# Patient Record
Sex: Female | Born: 2000 | Hispanic: No | Marital: Single | Smoking: Never smoker
Health system: Southern US, Community
[De-identification: ages and names within clinical notes are randomized; demographics above are authoritative.]

## PROBLEM LIST (undated history)

## (undated) ENCOUNTER — Inpatient Hospital Stay (HOSPITAL_COMMUNITY): Payer: Self-pay

## (undated) DIAGNOSIS — F99 Mental disorder, not otherwise specified: Secondary | ICD-10-CM

## (undated) DIAGNOSIS — F419 Anxiety disorder, unspecified: Secondary | ICD-10-CM

## (undated) DIAGNOSIS — Z91410 Personal history of adult physical and sexual abuse: Secondary | ICD-10-CM

## (undated) HISTORY — PX: DILATION AND EVACUATION: SHX1459

---

## 2018-03-26 ENCOUNTER — Encounter (HOSPITAL_COMMUNITY): Payer: Self-pay | Admitting: *Deleted

## 2018-03-26 ENCOUNTER — Inpatient Hospital Stay (HOSPITAL_COMMUNITY): Payer: Self-pay

## 2018-03-26 ENCOUNTER — Inpatient Hospital Stay (HOSPITAL_COMMUNITY)
Admission: AD | Admit: 2018-03-26 | Discharge: 2018-03-26 | Disposition: A | Discharge status: Home / Self Care | Payer: Self-pay | Source: Ambulatory Visit | Attending: Obstetrics & Gynecology | Admitting: Obstetrics & Gynecology

## 2018-03-26 DIAGNOSIS — O034 Incomplete spontaneous abortion without complication: Secondary | ICD-10-CM

## 2018-03-26 DIAGNOSIS — Z880 Allergy status to penicillin: Secondary | ICD-10-CM | POA: Insufficient documentation

## 2018-03-26 DIAGNOSIS — Z789 Other specified health status: Secondary | ICD-10-CM

## 2018-03-26 DIAGNOSIS — O4690 Antepartum hemorrhage, unspecified, unspecified trimester: Secondary | ICD-10-CM

## 2018-03-26 HISTORY — DX: Anxiety disorder, unspecified: F41.9

## 2018-03-26 HISTORY — DX: Mental disorder, not otherwise specified: F99

## 2018-03-26 LAB — HCG, QUANTITATIVE, PREGNANCY: hCG, Beta Chain, Quant, S: 1636 m[IU]/mL — ABNORMAL HIGH (ref <5)

## 2018-03-26 LAB — COMPREHENSIVE METABOLIC PANEL
ALT: 13 U/L (ref 0–44)
AST: 16 U/L (ref 15–41)
Albumin: 3.8 g/dL (ref 3.5–5.0)
Alkaline Phosphatase: 49 U/L (ref 38–126)
Anion gap: 7 (ref 5–15)
BUN: 10 mg/dL (ref 6–20)
CHLORIDE: 104 mmol/L (ref 98–111)
CO2: 23 mmol/L (ref 22–32)
Calcium: 9.1 mg/dL (ref 8.9–10.3)
Creatinine, Ser: 0.51 mg/dL (ref 0.44–1.00)
GFR calc Af Amer: >60 mL/min (ref >60)
GFR calc non Af Amer: >60 mL/min (ref >60)
GLUCOSE: 94 mg/dL (ref 70–99)
POTASSIUM: 3.4 mmol/L — AB (ref 3.5–5.1)
SODIUM: 134 mmol/L — AB (ref 135–145)
TOTAL PROTEIN: 6.9 g/dL (ref 6.5–8.1)
Total Bilirubin: 0.4 mg/dL (ref 0.3–1.2)

## 2018-03-26 LAB — URINALYSIS, ROUTINE W REFLEX MICROSCOPIC
BILIRUBIN URINE: NEGATIVE
GLUCOSE, UA: NEGATIVE mg/dL
Ketones, ur: NEGATIVE mg/dL
LEUKOCYTES UA: NEGATIVE
Nitrite: NEGATIVE
PH: 8 (ref 5.0–8.0)
Protein, ur: 30 mg/dL — AB
Specific Gravity, Urine: 1.017 (ref 1.005–1.030)

## 2018-03-26 LAB — CBC WITH DIFFERENTIAL/PLATELET
BASOS ABS: 0 10*3/uL (ref 0.0–0.1)
Basophils Relative: 0 %
EOS PCT: 1 %
Eosinophils Absolute: 0.1 10*3/uL (ref 0.0–0.7)
HCT: 34 % — ABNORMAL LOW (ref 36.0–46.0)
Hemoglobin: 11.5 g/dL — ABNORMAL LOW (ref 12.0–15.0)
Lymphocytes Relative: 32 %
Lymphs Abs: 1.9 10*3/uL (ref 0.7–4.0)
MCH: 29 pg (ref 26.0–34.0)
MCHC: 33.8 g/dL (ref 30.0–36.0)
MCV: 85.6 fL (ref 78.0–100.0)
MONO ABS: 0.5 10*3/uL (ref 0.1–1.0)
Monocytes Relative: 8 %
Neutro Abs: 3.6 10*3/uL (ref 1.7–7.7)
Neutrophils Relative %: 59 %
PLATELETS: 259 10*3/uL (ref 150–400)
RBC: 3.97 MIL/uL (ref 3.87–5.11)
RDW: 13.1 % (ref 11.5–15.5)
WBC: 6.1 10*3/uL (ref 4.0–10.5)

## 2018-03-26 LAB — WET PREP, GENITAL
Clue Cells Wet Prep HPF POC: NONE SEEN
SPERM: NONE SEEN
Trich, Wet Prep: NONE SEEN
YEAST WET PREP: NONE SEEN

## 2018-03-26 LAB — ABO/RH: ABO/RH(D): A POS

## 2018-03-26 LAB — POCT PREGNANCY, URINE: Preg Test, Ur: POSITIVE — AB

## 2018-03-26 MED ORDER — OXYCODONE-IBUPROFEN 5-400 MG PO TABS: 1.0000 | ORAL_TABLET | Freq: Four times a day (QID) | ORAL | 0 refills | Status: AC | PRN

## 2018-03-26 MED ORDER — IBUPROFEN 600 MG PO TABS: 600.0000 mg | ORAL_TABLET | Freq: Four times a day (QID) | ORAL | 0 refills | Status: AC | PRN

## 2018-03-26 NOTE — MAU Note (Signed)
Kristin Avery is a 17 y.o.  here in MAU reporting: lower abdominal cramping and vaginal bleeding LMP: unknown; pt states she is pregnant Onset of complaint: cramping started yesterday. Bleeding started this morning after intercourse and has gotten worse throughout the day. Light pink to dark red in color. Not wearing a pad. Pain score: 9/10 Vitals:   03/26/18 1507  BP: 114/70  Pulse: 76  Resp: 20  Temp: 98 F (36.7 C)  SpO2: 100%      Lab orders placed from triage: ua and pregnancy test Arrived via ems

## 2018-03-26 NOTE — MAU Provider Note (Signed)
History     CSN: [id number]  Arrival date and time: 03/26/18 1433   First Provider Initiated Contact with Patient 03/26/18 1554      Chief Complaint  Patient presents with  . Vaginal Bleeding  . Abdominal Pain   HPI   Ms.Kristin Avery is a 17 y.o. female [id number] @ Unknown here in MAU with contractions and bleeding. Says the contractions started yesterday and the bleeding started today. Says that the bleeding is light, the bleeding started after sex today. The bleeding became dark today. Rates her pain 7/10, has not taken any medication.  This is a new problem. She has not started prenatal care.   OB History    Gravida  4   Para  0   Term  0   Preterm  0   AB  2   Living  0     SAB  2   TAB  0   Ectopic  0   Multiple  0   Live Births  0           Past Medical History:  Diagnosis Date  . Anxiety   . Mental disorder    PTSD    History reviewed. No pertinent surgical history.  History reviewed. No pertinent family history.  Social History   Tobacco Use  . Smoking status: Never Smoker  . Smokeless tobacco: Never Used  Substance Use Topics  . Alcohol use: Never    Frequency: Never  . Drug use: Yes    Types: Marijuana    Comment: last used 03-26-18    Allergies:  Allergies  Allergen Reactions  . Acetaminophen Anaphylaxis  . Augmentin [Amoxicillin-Pot Clavulanate] Anaphylaxis  . Penicillins Anaphylaxis    No medications prior to admission.   Results for orders placed or performed during the hospital encounter of 03/26/18 (from the past 48 hour(s))  Urinalysis, Routine w reflex microscopic     Status: Abnormal   Collection Time: 03/26/18  3:15 PM  Result Value Ref Range   Color, Urine YELLOW YELLOW   APPearance CLOUDY (A) CLEAR   Specific Gravity, Urine 1.017 1.005 - 1.030   pH 8.0 5.0 - 8.0   Glucose, UA NEGATIVE NEGATIVE mg/dL   Hgb urine dipstick MODERATE (A) NEGATIVE   Bilirubin Urine NEGATIVE NEGATIVE   Ketones, ur NEGATIVE  NEGATIVE mg/dL   Protein, ur 30 (A) NEGATIVE mg/dL   Nitrite NEGATIVE NEGATIVE   Leukocytes, UA NEGATIVE NEGATIVE   RBC / HPF 11-20 0 - 5 RBC/hpf   Bacteria, UA RARE (A) NONE SEEN   Squamous Epithelial / LPF 6-10 0 - 5   Mucus PRESENT    Amorphous Crystal PRESENT     Comment: Performed at [facility], [address] [place], [place] [address]  Pregnancy, urine POC     Status: Abnormal   Collection Time: 03/26/18  3:16 PM  Result Value Ref Range   Preg Test, Ur POSITIVE (A) NEGATIVE    Comment:        THE SENSITIVITY OF THIS METHODOLOGY IS >24 mIU/mL   Wet prep, genital     Status: Abnormal   Collection Time: 03/26/18  4:06 PM  Result Value Ref Range   Yeast Wet Prep HPF POC NONE SEEN NONE SEEN   Trich, Wet Prep NONE SEEN NONE SEEN   Clue Cells Wet Prep HPF POC NONE SEEN NONE SEEN   WBC, Wet Prep HPF POC FEW (A) NONE SEEN    Comment: MANY BACTERIA SEEN  Sperm NONE SEEN     Comment: Performed at [facility], [address] [place], [place] [address]  CBC with Differential/Platelet     Status: Abnormal   Collection Time: 03/26/18  4:43 PM  Result Value Ref Range   WBC 6.1 4.0 - 10.5 K/uL   RBC 3.97 3.87 - 5.11 MIL/uL   Hemoglobin 11.5 (L) 12.0 - 15.0 g/dL   HCT 34.0 (L) 36.0 - 46.0 %   MCV 85.6 78.0 - 100.0 fL   MCH 29.0 26.0 - 34.0 pg   MCHC 33.8 30.0 - 36.0 g/dL   RDW 13.1 11.5 - 15.5 %   Platelets 259 150 - 400 K/uL   Neutrophils Relative % 59 %   Neutro Abs 3.6 1.7 - 7.7 K/uL   Lymphocytes Relative 32 %   Lymphs Abs 1.9 0.7 - 4.0 K/uL   Monocytes Relative 8 %   Monocytes Absolute 0.5 0.1 - 1.0 K/uL   Eosinophils Relative 1 %   Eosinophils Absolute 0.1 0.0 - 0.7 K/uL   Basophils Relative 0 %   Basophils Absolute 0.0 0.0 - 0.1 K/uL    Comment: Performed at [facility], [address] [place], [place] [address]  Comprehensive metabolic panel     Status: Abnormal   Collection Time: 03/26/18  4:43 PM  Result Value Ref Range   Sodium 134 (L) 135 -  145 mmol/L   Potassium 3.4 (L) 3.5 - 5.1 mmol/L   Chloride 104 98 - 111 mmol/L   CO2 23 22 - 32 mmol/L   Glucose, Bld 94 70 - 99 mg/dL   BUN 10 6 - 20 mg/dL   Creatinine, Ser 0.51 0.44 - 1.00 mg/dL   Calcium 9.1 8.9 - 10.3 mg/dL   Total Protein 6.9 6.5 - 8.1 g/dL   Albumin 3.8 3.5 - 5.0 g/dL   AST 16 15 - 41 U/L   ALT 13 0 - 44 U/L   Alkaline Phosphatase 49 38 - 126 U/L   Total Bilirubin 0.4 0.3 - 1.2 mg/dL   GFR calc non Af Amer >60 >60 mL/min   GFR calc Af Amer >60 >60 mL/min    Comment: (NOTE) The eGFR has been calculated using the CKD EPI equation. This calculation has not been validated in all clinical situations. eGFR's persistently <60 mL/min signify possible Chronic Kidney Disease.    Anion gap 7 5 - 15    Comment: Performed at [facility], [address], [place], [place] [address]  ABO/Rh     Status: None (Preliminary result)   Collection Time: 03/26/18  4:43 PM  Result Value Ref Range   ABO/RH(D)      A POS Performed at [facility], [address], [place],  [address]   hCG, quantitative, pregnancy     Status: Abnormal   Collection Time: 03/26/18  4:43 PM  Result Value Ref Range   hCG, Beta Chain, Quant, S 1,636 (H) <5 mIU/mL    Comment:          GEST. AGE      CONC.  (mIU/mL)   <=1 WEEK        5 - 50     2 WEEKS       50 - 500     3 WEEKS       100 - 10,000     4 WEEKS     1,000 - 30,000     5 WEEKS     3,500 - 115,000  6-8 WEEKS     12,000 - 270,000    12 WEEKS     15,000 - 220,000        FEMALE AND NON-PREGNANT FEMALE:     LESS THAN 5 mIU/mL Performed at [facility], [address], [place], [place] [address]    US Ob Less Than 14 Weeks With Ob Transvaginal  Result Date: 03/26/2018 CLINICAL DATA:  Vaginal bleeding.  Unknown LMP. EXAM: OBSTETRIC <14 WK ULTRASOUND TECHNIQUE: Transabdominal ultrasound was performed for evaluation of the gestation as well as the maternal uterus and adnexal regions. COMPARISON:  None. FINDINGS:  Intrauterine gestational sac: Single Yolk sac:  Not visualized. Embryo:  Visualized. Cardiac Activity: Not visualized. Heart Rate: N/A bpm CRL: 61.0 mm   12 w 4 d                  [place] EDC: 10/04/2018 Subchorionic hemorrhage:  None. Maternal uterus/adnexae: No free fluid. IMPRESSION: Single intrauterine gestation with estimated gestational age of [redacted] week 4 day by crown-rump length. No evidence for fetal cardiac activity on M-mode Doppler, color Doppler, or cine imaging. Sonographic imaging features are consistent with fetal demise. Electronically Signed   By: Misty Stanley M.D.   On: 03/26/2018 17:59   Review of Systems  Constitutional: Negative for fever.  Gastrointestinal: Positive for abdominal pain.  Genitourinary: Positive for vaginal bleeding.   Physical Exam   Blood pressure 114/70, pulse 76, temperature 98 F (36.7 C), temperature source Oral, resp. rate 20, weight 59.9 kg, SpO2 100 %.  Physical Exam  Constitutional: She is oriented to person, place, and time. She appears well-developed and well-nourished. No distress.  HENT:  Head: Normocephalic.  Eyes: Pupils are equal, round, and reactive to light.  GI: Soft. She exhibits no distension. There is no tenderness.  Genitourinary:  Genitourinary Comments: Wet prep and GC collected without speculum Bimanual exam: Cervix closed, thick, posterior. No blood noted on exam glove.   Musculoskeletal: Normal range of motion.  Neurological: She is alert and oriented to person, place, and time.  Skin: Skin is warm. She is not diaphoretic.  Psychiatric: Her behavior is normal.   MAU Course  Procedures  None  MDM  A positive blood type  Wet prep & GC HIV, CBC, Hcg, ABO US OB transvaginal  Discussed results in detail with the patient and her significant other. Patient and significant other understandably upset at this time. Plan of care discussed briefly.  Discussed patient with Dr. Harolyn Rutherford who agrees with the plan of care.   Assessment  and Plan   A:  1. Incomplete miscarriage   2. Vaginal bleeding in pregnancy   3. Date of last menstrual period (LMP) unknown     P:  Discharge home in stable condition Rx: Ibuprofen/codeine  The office will call you Monday morning to schedule surgery (D&E) NPO past midnight tomorrow Return to MAU if symptoms worsen Support given  Noni Saupe I, NP 03/26/2018 7:33 PM

## 2018-03-26 NOTE — Discharge Instructions (Signed)

## 2018-03-28 ENCOUNTER — Ambulatory Visit (HOSPITAL_COMMUNITY)
Admission: AD | Admit: 2018-03-28 | Discharge: 2018-03-28 | Disposition: A | Discharge status: Home / Self Care | Payer: Self-pay | Source: Ambulatory Visit | Attending: Obstetrics and Gynecology | Admitting: Obstetrics and Gynecology

## 2018-03-28 ENCOUNTER — Ambulatory Visit (HOSPITAL_COMMUNITY): Payer: Self-pay | Admitting: Anesthesiology

## 2018-03-28 ENCOUNTER — Ambulatory Visit (HOSPITAL_COMMUNITY): Admission: RE | Admit: 2018-03-28 | Payer: Self-pay | Source: Ambulatory Visit | Admitting: Obstetrics and Gynecology

## 2018-03-28 ENCOUNTER — Ambulatory Visit (HOSPITAL_COMMUNITY): Payer: Self-pay

## 2018-03-28 ENCOUNTER — Encounter (HOSPITAL_COMMUNITY)
Admission: AD | Disposition: A | Discharge status: Home / Self Care | Payer: Self-pay | Source: Ambulatory Visit | Attending: Obstetrics and Gynecology

## 2018-03-28 ENCOUNTER — Encounter (HOSPITAL_COMMUNITY): Admission: RE | Payer: Self-pay | Source: Ambulatory Visit

## 2018-03-28 ENCOUNTER — Encounter: Payer: Self-pay | Admitting: Obstetrics and Gynecology

## 2018-03-28 DIAGNOSIS — O021 Missed abortion: Secondary | ICD-10-CM | POA: Insufficient documentation

## 2018-03-28 HISTORY — PX: DILATION AND EVACUATION: SHX1459

## 2018-03-28 HISTORY — DX: Personal history of adult physical and sexual abuse: Z91.410

## 2018-03-28 LAB — GC/CHLAMYDIA PROBE AMP (~~LOC~~) NOT AT ARMC
CHLAMYDIA, DNA PROBE: NEGATIVE
NEISSERIA GONORRHEA: NEGATIVE

## 2018-03-28 SURGERY — DILATION AND EVACUATION, UTERUS
Anesthesia: Choice

## 2018-03-28 MED ORDER — LIDOCAINE HCL (CARDIAC) PF 100 MG/5ML IV SOSY
PREFILLED_SYRINGE | INTRAVENOUS | Status: DC | PRN
  Administered 2018-03-28 (×2): 50 mg via INTRAVENOUS

## 2018-03-28 MED ORDER — LACTATED RINGERS IV SOLN
INTRAVENOUS | Status: DC
  Administered 2018-03-28: 15:00:00 via INTRAVENOUS

## 2018-03-28 MED ORDER — PROPOFOL 10 MG/ML IV BOLUS
INTRAVENOUS | Status: DC | PRN
  Administered 2018-03-28: 150 mg via INTRAVENOUS

## 2018-03-28 MED ORDER — MIDAZOLAM HCL 2 MG/2ML IJ SOLN
INTRAMUSCULAR | Status: DC | PRN
  Administered 2018-03-28: 2 mg via INTRAVENOUS

## 2018-03-28 MED ORDER — KETOROLAC TROMETHAMINE 30 MG/ML IJ SOLN
INTRAMUSCULAR | Status: DC | PRN
  Administered 2018-03-28: 30 mg via INTRAVENOUS

## 2018-03-28 MED ORDER — LIDOCAINE HCL (CARDIAC) PF 100 MG/5ML IV SOSY
PREFILLED_SYRINGE | INTRAVENOUS | Status: AC
  Filled 2018-03-28: qty 5

## 2018-03-28 MED ORDER — ONDANSETRON HCL 4 MG/2ML IJ SOLN
INTRAMUSCULAR | Status: DC | PRN
  Administered 2018-03-28: 4 mg via INTRAVENOUS

## 2018-03-28 MED ORDER — FENTANYL CITRATE (PF) 100 MCG/2ML IJ SOLN
INTRAMUSCULAR | Status: DC | PRN
  Administered 2018-03-28: 100 ug via INTRAVENOUS

## 2018-03-28 MED ORDER — ONDANSETRON HCL 4 MG/2ML IJ SOLN
INTRAMUSCULAR | Status: AC
  Filled 2018-03-28: qty 2

## 2018-03-28 MED ORDER — ROCURONIUM BROMIDE 100 MG/10ML IV SOLN
INTRAVENOUS | Status: AC
  Filled 2018-03-28: qty 1

## 2018-03-28 MED ORDER — DOXYCYCLINE HYCLATE 100 MG IV SOLR
200.0000 mg | INTRAVENOUS | Status: AC
  Administered 2018-03-28: 200 mg via INTRAVENOUS
  Filled 2018-03-28: qty 200

## 2018-03-28 MED ORDER — LACTATED RINGERS IV SOLN
INTRAVENOUS | Status: DC
  Administered 2018-03-28: 13:00:00 via INTRAVENOUS

## 2018-03-28 MED ORDER — SCOPOLAMINE 1 MG/3DAYS TD PT72
1.0000 | MEDICATED_PATCH | Freq: Once | TRANSDERMAL | Status: DC
  Administered 2018-03-28: 1.5 mg via TRANSDERMAL

## 2018-03-28 MED ORDER — PROPOFOL 10 MG/ML IV BOLUS
INTRAVENOUS | Status: AC
  Filled 2018-03-28: qty 20

## 2018-03-28 MED ORDER — DEXAMETHASONE SODIUM PHOSPHATE 10 MG/ML IJ SOLN
INTRAMUSCULAR | Status: DC | PRN
  Administered 2018-03-28: 4 mg via INTRAVENOUS

## 2018-03-28 MED ORDER — DEXAMETHASONE SODIUM PHOSPHATE 4 MG/ML IJ SOLN
INTRAMUSCULAR | Status: AC
  Filled 2018-03-28: qty 1

## 2018-03-28 MED ORDER — MIDAZOLAM HCL 2 MG/2ML IJ SOLN
INTRAMUSCULAR | Status: AC
  Filled 2018-03-28: qty 2

## 2018-03-28 MED ORDER — GLYCOPYRROLATE 0.2 MG/ML IJ SOLN
INTRAMUSCULAR | Status: AC
  Filled 2018-03-28: qty 1

## 2018-03-28 MED ORDER — FENTANYL CITRATE (PF) 250 MCG/5ML IJ SOLN
INTRAMUSCULAR | Status: AC
  Filled 2018-03-28: qty 5

## 2018-03-28 MED ORDER — OXYCODONE HCL 5 MG PO TABS: 5.0000 mg | ORAL_TABLET | Freq: Four times a day (QID) | ORAL | 0 refills | Status: AC | PRN

## 2018-03-28 MED ORDER — KETOROLAC TROMETHAMINE 30 MG/ML IJ SOLN
INTRAMUSCULAR | Status: AC
  Filled 2018-03-28: qty 1

## 2018-03-28 MED ORDER — FENTANYL CITRATE (PF) 100 MCG/2ML IJ SOLN
25.0000 ug | INTRAMUSCULAR | Status: DC | PRN
  Administered 2018-03-28 (×2): 50 ug via INTRAVENOUS

## 2018-03-28 SURGICAL SUPPLY — 20 items
CATH ROBINSON RED A/P 16FR (CATHETERS) ×3 IMPLANT
DECANTER SPIKE VIAL GLASS SM (MISCELLANEOUS) ×3 IMPLANT
GLOVE BIOGEL PI IND STRL 6.5 (GLOVE) ×1 IMPLANT
GLOVE BIOGEL PI IND STRL 7.0 (GLOVE) ×1 IMPLANT
GLOVE BIOGEL PI INDICATOR 6.5 (GLOVE) ×2
GLOVE BIOGEL PI INDICATOR 7.0 (GLOVE) ×2
GLOVE ORTHOPEDIC STR SZ6.5 (GLOVE) ×3 IMPLANT
GOWN STRL REUS W/TWL LRG LVL3 (GOWN DISPOSABLE) ×6 IMPLANT
KIT BERKELEY 1ST TRIMESTER 3/8 (MISCELLANEOUS) ×3 IMPLANT
NS IRRIG 1000ML POUR BTL (IV SOLUTION) ×3 IMPLANT
PACK VAGINAL MINOR WOMEN LF (CUSTOM PROCEDURE TRAY) ×3 IMPLANT
PAD OB MATERNITY 4.3X12.25 (PERSONAL CARE ITEMS) ×3 IMPLANT
PAD PREP 24X48 CUFFED NSTRL (MISCELLANEOUS) ×3 IMPLANT
SET BERKELEY SUCTION TUBING (SUCTIONS) ×3 IMPLANT
TOWEL OR 17X24 6PK STRL BLUE (TOWEL DISPOSABLE) ×6 IMPLANT
VACURETTE 10 RIGID CVD (CANNULA) IMPLANT
VACURETTE 12 RIGID CVD (CANNULA) ×3 IMPLANT
VACURETTE 7MM CVD STRL WRAP (CANNULA) IMPLANT
VACURETTE 8 RIGID CVD (CANNULA) IMPLANT
VACURETTE 9 RIGID CVD (CANNULA) IMPLANT

## 2018-03-28 NOTE — Progress Notes (Signed)
Patient Kristin Avery and Boyfriend Kristin Avery home with Kristin Avery via car.

## 2018-03-28 NOTE — Progress Notes (Signed)
Patient in Phase 2 with Boyfriend Eliberto Ivoryustin.  Reviewed discharge instructions, all questions answered.  Comfort packet given along with white heart.  At 1720, Patient and Boyfriend Eliberto Ivoryustin waiting for ride home.  Watching TV, eating snacks.

## 2018-03-28 NOTE — Op Note (Signed)
Kristin Avery PROCEDURE DATE:  03/28/2018  PREOPERATIVE DIAGNOSIS: missed abortion @ 12 weeks POSTOPERATIVE DIAGNOSIS: The same PROCEDURE: suction dilation and evacuation under ultrasound guidance SURGEON:  Dr. Baldemar LenisK. Meryl Joaquim Tolen  INDICATIONS: 17 y.o.  [phone] [id number] presenting with bleeding s/p missed abortion, recommended for surgical management.  Risks of surgery were discussed with the patient including but not limited to: bleeding which may require transfusion; infection which may require antibiotics; injury to uterus or surrounding organs; need for additional procedures including laparotomy or laparoscopy; possibility of intrauterine scarring which may impair future fertility; and other postoperative/anesthesia complications. Written informed consent was obtained.  ABO, Rh: --/--/A POS Performed at [facility], [address] [place], [place] [address]  [phone](09/21 1643) CBC    Component Value Date/Time   WBC 6.1 03/26/2018 1643   RBC 3.97 03/26/2018 1643   HGB 11.5 (L) 03/26/2018 1643   HCT 34.0 (L) 03/26/2018 1643   PLT 259 03/26/2018 1643   MCV 85.6 03/26/2018 1643   MCH 29.0 03/26/2018 1643   MCHC 33.8 03/26/2018 1643   RDW 13.1 03/26/2018 1643   LYMPHSABS 1.9 03/26/2018 1643   MONOABS 0.5 03/26/2018 1643   EOSABS 0.1 03/26/2018 1643   BASOSABS 0.0 03/26/2018 1643     FINDINGS:   Significant amount of foul smelling yellow tinged fluid within uterus, fetus approx 12 weks sized, normal appearing external female genitalia. Empty endometrial stripe noted on ultrasound at the end of the procedure.   ANESTHESIA:   General anesthesia INTRAVENOUS FLUIDS:  900 mL of LR ESTIMATED BLOOD LOSS:  250 mL URINE OUTPUT: 25 mL clear yellow urine SPECIMENS:  Products of conception sent to pathology COMPLICATIONS:  None immediate.  PROCEDURE DETAILS:  The patient received intravenous Doxycycline while in the preoperative area.  She was then taken to the operating room where anesthesia was  administered and was found to be adequate.  After an adequate timeout was performed, she was placed in the dorsal lithotomy position and examined; then prepped and draped in the sterile manner.   Her bladder was catheterized for return of clear, yellow urine. A vaginal speculum was then placed in the patient's vagina and a single tooth tenaculum was applied to the anterior lip of the cervix.  The cervix was gently dilated under ultrasound guidance to accommodate a 12 mm suction curette. The suction curette was gently advanced to the uterine fundus and activated. The curette was slowly rotated to clear the uterus of products of conception.  The forceps were used to remove tissue not able to be cleared by the suction curette. The suction curette was again introduced to the fundus under ultrasound guidance, activated and gently rotated. This was repeated until the endometrial cavity was cleared and a clean stripe was noted on ultrasound. A sharp curettage was then performed to confirm complete emptying of the uterus. The suction curette was advanced to the fundus and activated one last time under ultrasound guidance and removed and the procedure was finished. There was an empty endometrial stripe noted on the ultrasound at the end of the curettage. There was minimal bleeding noted at the end of the procedure, and the tenaculum removed with good hemostasis noted at the tenaculum site with application of silver nitrate.  Four limbs, calvarium, rib cage, placenta were accounted for at the end of the procedure.  All instruments were removed from the patient's vagina.  Sponge and instrument counts were correct times three.  The patient tolerated the procedure well and was taken to the  recovery area awake, extubated and in stable condition.  The patient will be discharged to home as per PACU criteria.  Routine postoperative instructions given.  She was prescribed Roxicodone, Ibuprofen.  She will follow up in the clinic  in 2-3 weeks for postoperative evaluation.   Baldemar Lenis, M.D. [facility]

## 2018-03-28 NOTE — Anesthesia Preprocedure Evaluation (Signed)
Anesthesia Evaluation  Patient identified by MRN, date of birth, ID band Patient awake    Reviewed: Allergy & Precautions, NPO status , Patient's Chart, lab work & pertinent test results  Airway Mallampati: I  TM Distance: >3 FB Neck ROM: Full    Dental no notable dental hx. (+) Teeth Intact, Dental Advisory Given   Pulmonary neg pulmonary ROS,    Pulmonary exam normal breath sounds clear to auscultation       Cardiovascular negative cardio ROS Normal cardiovascular exam Rhythm:Regular Rate:Normal     Neuro/Psych Anxiety PTSDnegative neurological ROS     GI/Hepatic negative GI ROS, Neg liver ROS,   Endo/Other  negative endocrine ROS  Renal/GU negative Renal ROS  negative genitourinary   Musculoskeletal negative musculoskeletal ROS (+)   Abdominal   Peds  Hematology negative hematology ROS (+)   Anesthesia Other Findings D&E for missed abortion  Reproductive/Obstetrics                             Anesthesia Physical Anesthesia Plan  ASA: II  Anesthesia Plan: General   Post-op Pain Management:    Induction: Intravenous  PONV Risk Score and Plan: 3 and Midazolam, Scopolamine patch - Pre-op, Dexamethasone and Ondansetron  Airway Management Planned: LMA  Additional Equipment:   Intra-op Plan:   Post-operative Plan: Extubation in OR  Informed Consent: I have reviewed the patients History and Physical, chart, labs and discussed the procedure including the risks, benefits and alternatives for the proposed anesthesia with the patient or authorized representative who has indicated his/her understanding and acceptance.   Dental advisory given  Plan Discussed with: CRNA  Anesthesia Plan Comments:         Anesthesia Quick Evaluation

## 2018-03-28 NOTE — OR Nursing (Signed)
Offer of chaplain. States not at this time. Margarita Mailoni Makela Niehoff rn

## 2018-03-28 NOTE — Transfer of Care (Signed)
Immediate Anesthesia Transfer of Care Note  Patient: Kristin Avery  Procedure(s) Performed: DILATATION AND EVACUATION WITH ULTRASOUND GUIDANCE (N/A )  Patient Location: PACU  Anesthesia Type:General  Level of Consciousness: sedated  Airway & Oxygen Therapy: Patient Spontanous Breathing  Post-op Assessment: Post -op Vital signs reviewed and stable  Post vital signs: Reviewed and stable  Last Vitals:  Vitals Value Taken Time  BP    Temp    Pulse 77 03/28/2018  2:37 PM  Resp    SpO2 99 % 03/28/2018  2:37 PM  Vitals shown include unvalidated device data.  Last Pain:  Vitals:   03/28/18 1324  TempSrc: Oral         Complications: No apparent anesthesia complications

## 2018-03-28 NOTE — Discharge Instructions (Signed)

## 2018-03-28 NOTE — Anesthesia Procedure Notes (Signed)
Procedure Name: LMA Insertion Date/Time: 03/28/2018 1:56 PM Performed by: Algis GreenhouseBurger, Rameen Gohlke A, CRNA Pre-anesthesia Checklist: Patient being monitored, Patient identified, Emergency Drugs available and Suction available Patient Re-evaluated:Patient Re-evaluated prior to induction Oxygen Delivery Method: Circle system utilized Preoxygenation: Pre-oxygenation with 100% oxygen Induction Type: IV induction and Inhalational induction Ventilation: Mask ventilation without difficulty LMA: LMA inserted LMA Size: 4.0 Number of attempts: 1 Dental Injury: Teeth and Oropharynx as per pre-operative assessment

## 2018-03-28 NOTE — Anesthesia Postprocedure Evaluation (Signed)
Anesthesia Post Note  Patient: Kristin Avery  Procedure(s) Performed: DILATATION AND EVACUATION WITH ULTRASOUND GUIDANCE (N/A )     Patient location during evaluation: PACU Anesthesia Type: General Level of consciousness: awake and alert Pain management: pain level controlled Vital Signs Assessment: post-procedure vital signs reviewed and stable Respiratory status: spontaneous breathing, nonlabored ventilation, respiratory function stable and patient connected to nasal cannula oxygen Cardiovascular status: blood pressure returned to baseline and stable Postop Assessment: no apparent nausea or vomiting Anesthetic complications: no    Last Vitals:  Vitals:   03/28/18 1515 03/28/18 1530  BP: 107/73 110/62  Pulse: 62 63  Resp: 12 15  Temp:    SpO2: 100% 100%    Last Pain:  Vitals:   03/28/18 1440  TempSrc:   PainSc: 0-No pain   Pain Goal:                 Aul Mangieri L Shambhavi Salley
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
## 2018-03-29 ENCOUNTER — Encounter (HOSPITAL_COMMUNITY): Payer: Self-pay | Admitting: Obstetrics and Gynecology

## 2018-03-29 LAB — CULTURE, OB URINE
Culture: 2000 — AB
Special Requests: NORMAL

## 2018-03-29 LAB — HIV ANTIBODY (ROUTINE TESTING W REFLEX): HIV SCREEN 4TH GENERATION: NONREACTIVE

## 2018-04-14 LAB — CHROMOSOME STD, POC(TISSUE)-NCBH

## 2018-04-20 ENCOUNTER — Encounter (HOSPITAL_COMMUNITY): Payer: Self-pay

## 2018-04-25 ENCOUNTER — Other Ambulatory Visit (HOSPITAL_COMMUNITY): Payer: Self-pay

## 2020-03-21 IMAGING — US US OB < 14 WEEKS - US OB TV
2 series · 8 of 8 positions shown · non-contrast
Comparison: None.

CLINICAL DATA: Vaginal bleeding.  Unknown LMP.

EXAM:
OBSTETRIC <14 WK ULTRASOUND
TECHNIQUE: Transabdominal ultrasound was performed for evaluation of the
gestation as well as the maternal uterus and adnexal regions.

[Series 1: us ob < 14 weeks - us ob tv · 4 acquisitions, 4 frames shown (1 of 2)]
[im 1/4]
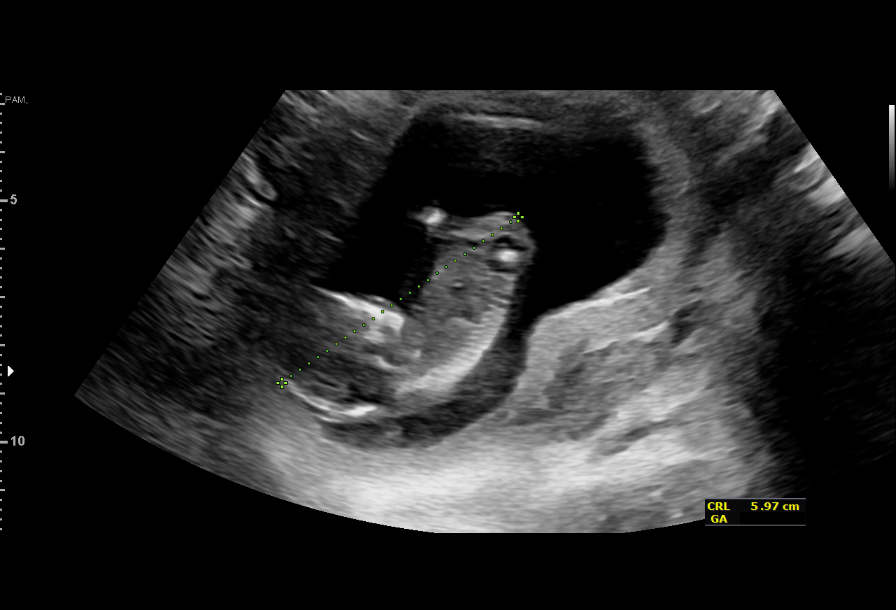
[im 2/4]
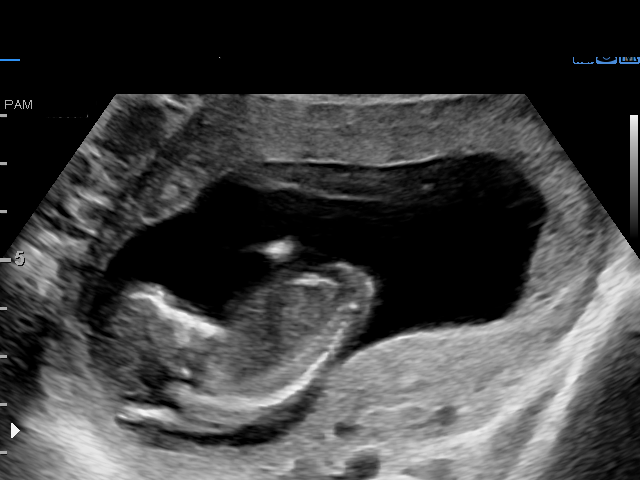
[im 3/4]
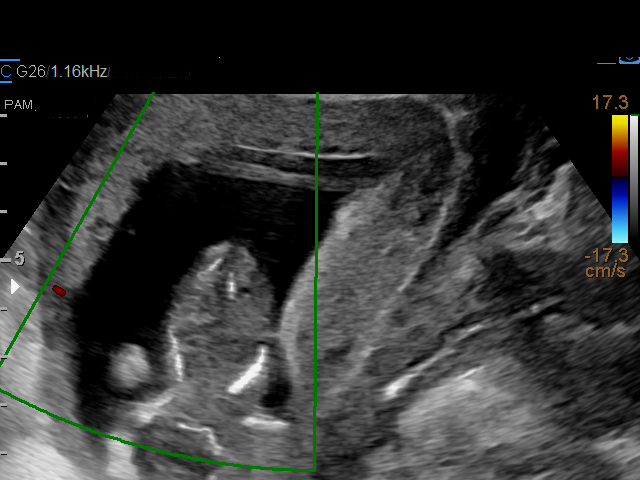
[im 4/4]
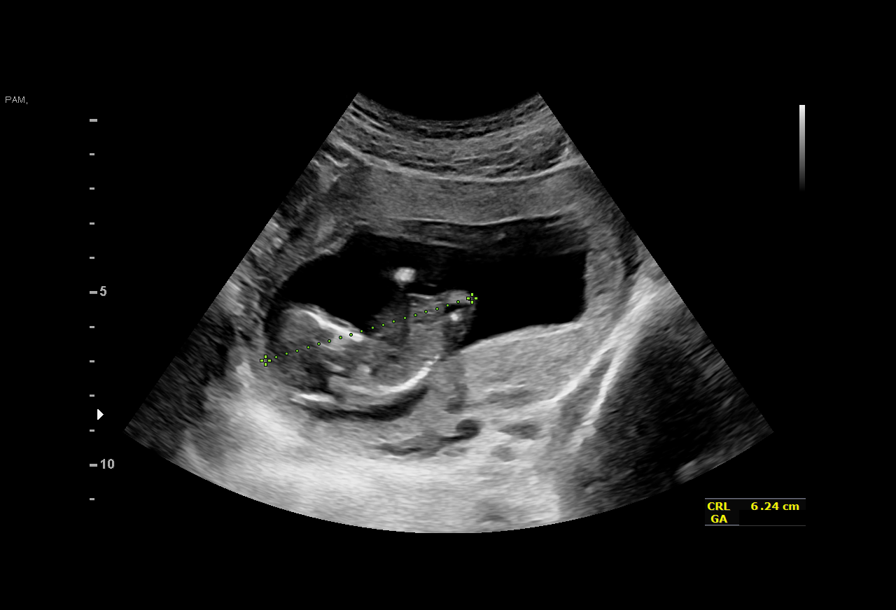

[Series 2: us ob < 14 weeks - us ob tv · 4 of 4 slices shown (2 of 2)]
[im 1/4]
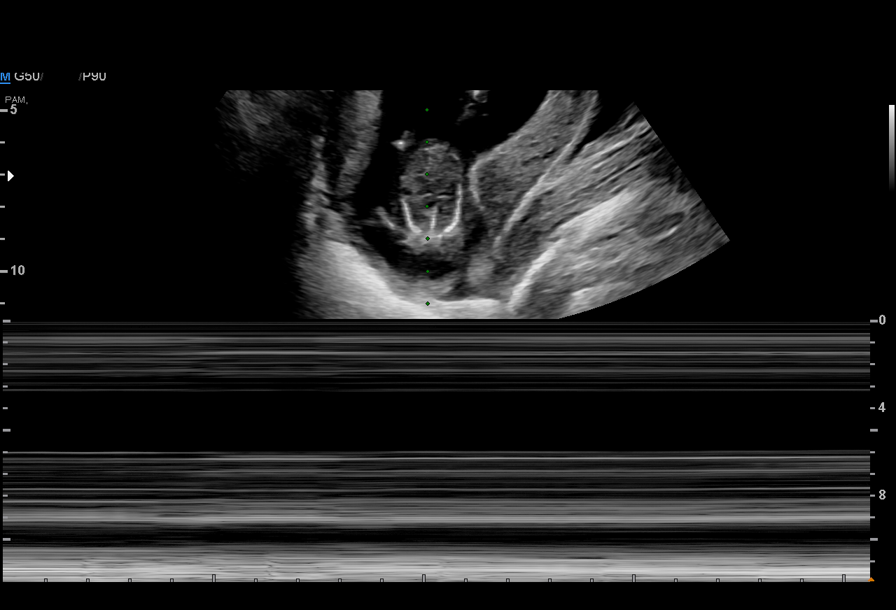
[im 2/4]
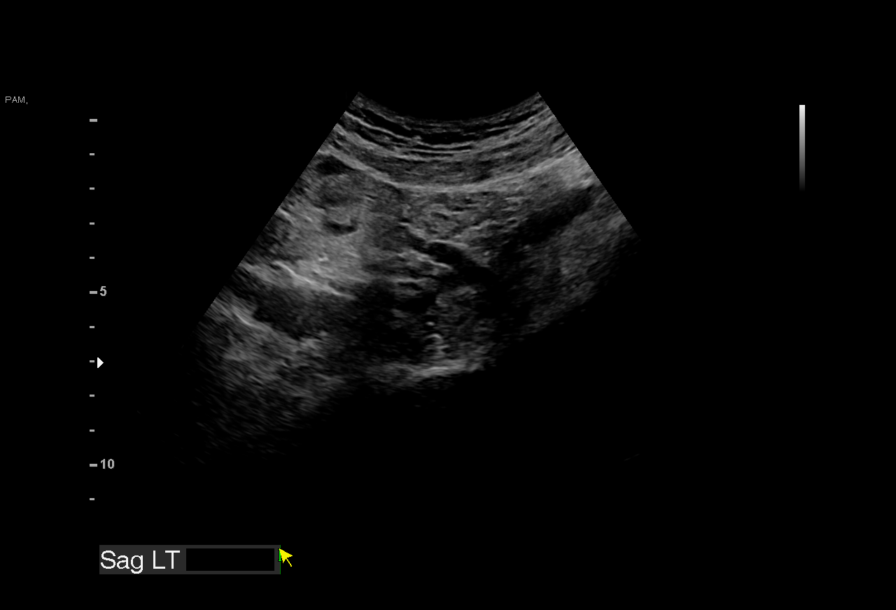
[im 3/4]
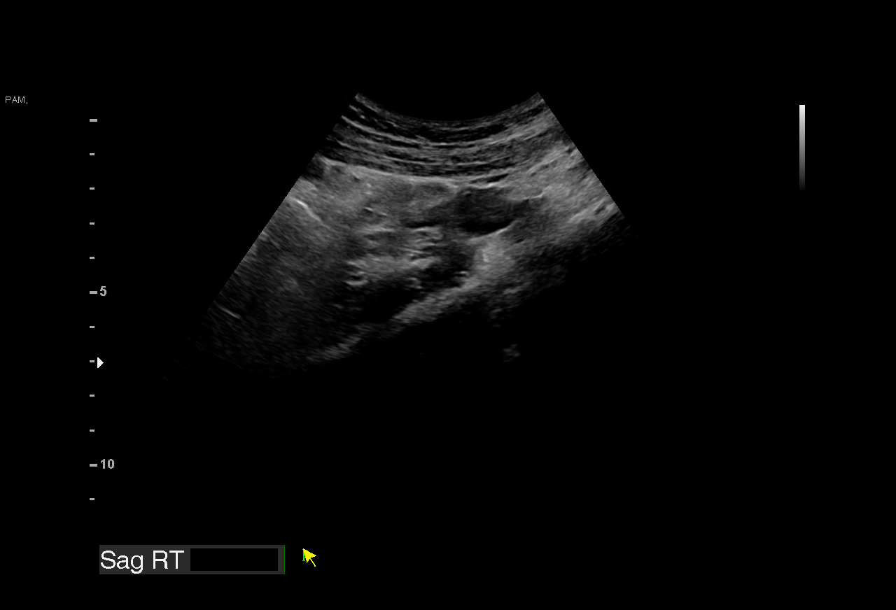
[im 4/4]
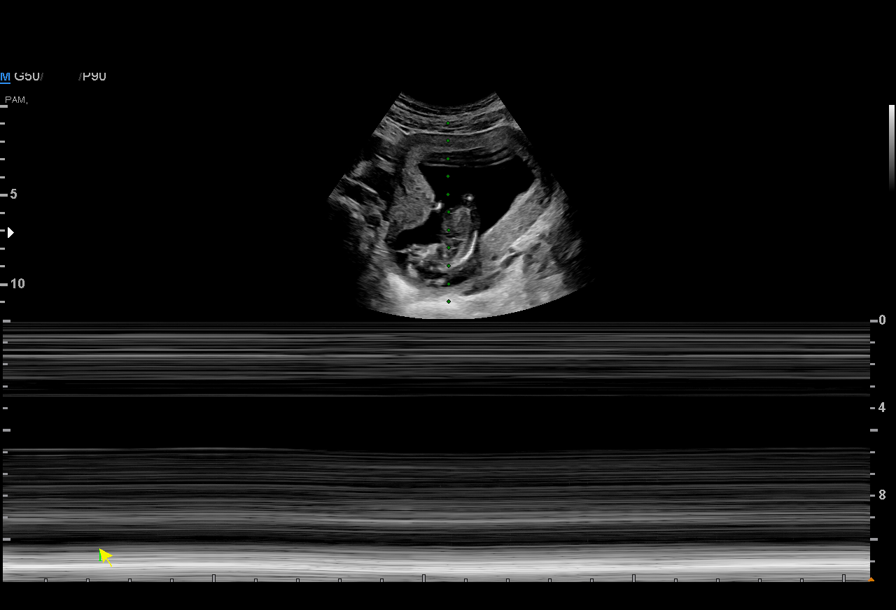

[8 of 8 positions shown; findings below may reference images not displayed]

FINDINGS: Intrauterine gestational sac: Single

Yolk sac:  Not visualized.

Embryo:  Visualized.

Cardiac Activity: Not visualized.

Heart Rate: N/A bpm

CRL: 61.0 mm   12 w 4 d                  [place] EDC: 10/04/2018

Subchorionic hemorrhage:  None.

Maternal uterus/adnexae: No free fluid.
IMPRESSION: Single intrauterine gestation with estimated gestational age of 12
week 4 day by crown-rump length. No evidence for fetal cardiac
activity on M-mode Doppler, color Doppler, or cine imaging.
Sonographic imaging features are consistent with fetal demise.

## 2023-11-30 ENCOUNTER — Inpatient Hospital Stay (HOSPITAL_COMMUNITY): Admission: AD | Admit: 2023-11-30 | Payer: Self-pay | Source: Intra-hospital | Admitting: Psychiatry
